# Patient Record
Sex: Male | Born: 1990 | Race: Black or African American | Hispanic: No | Marital: Single | Smoking: Never smoker
Health system: Southern US, Community
[De-identification: ages and names within clinical notes are randomized; demographics above are authoritative.]

---

## 2010-07-12 ENCOUNTER — Ambulatory Visit: Payer: Self-pay | Admitting: Family Medicine

## 2010-10-24 ENCOUNTER — Ambulatory Visit: Payer: Self-pay | Admitting: Internal Medicine

## 2010-11-08 ENCOUNTER — Ambulatory Visit: Payer: Self-pay | Admitting: Internal Medicine

## 2011-07-09 ENCOUNTER — Ambulatory Visit: Payer: Self-pay | Admitting: Internal Medicine

## 2011-11-16 IMAGING — CR DG CHEST 2V
1 series · 3 of 3 positions shown · non-contrast
Comparison: none

REASON FOR EXAM: asthma - please fax report [phone]
COMMENTS:

[Series 1: view not recorded · 0.17mm/px · 3 of 3 slices shown]
[im 1/3]
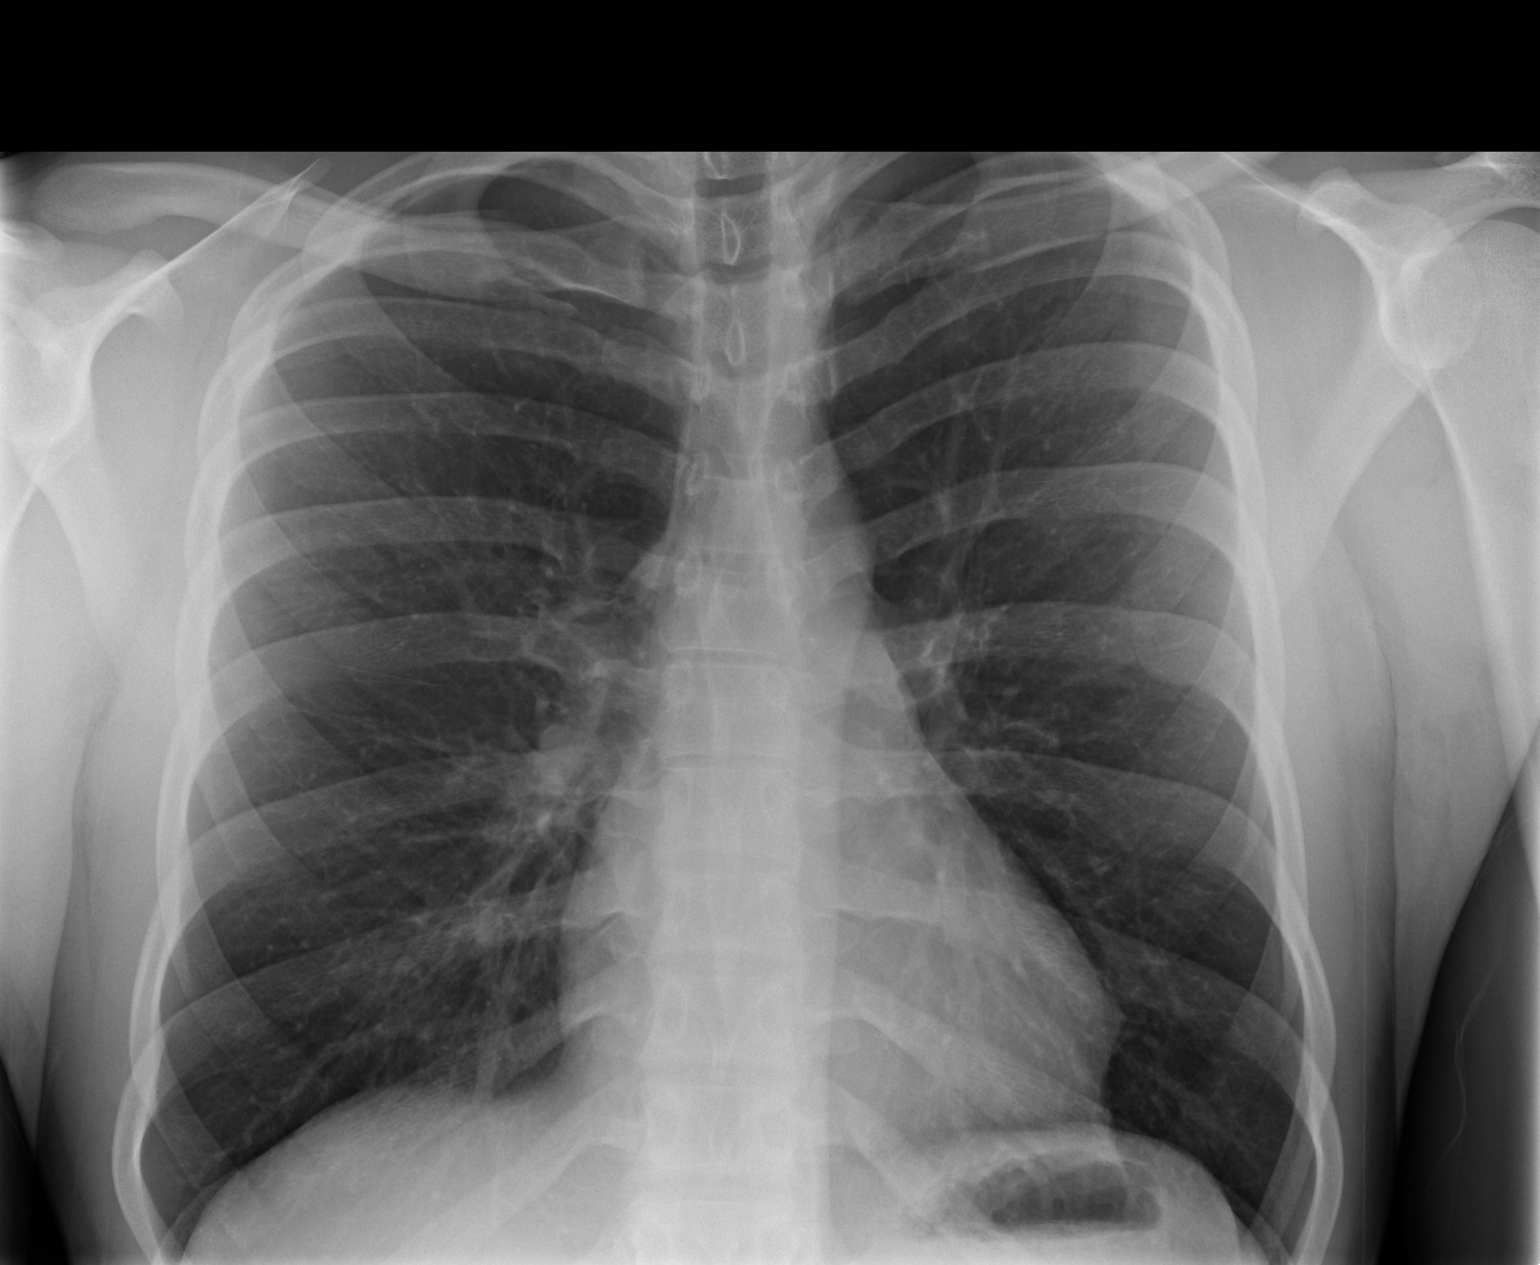
[im 2/3]
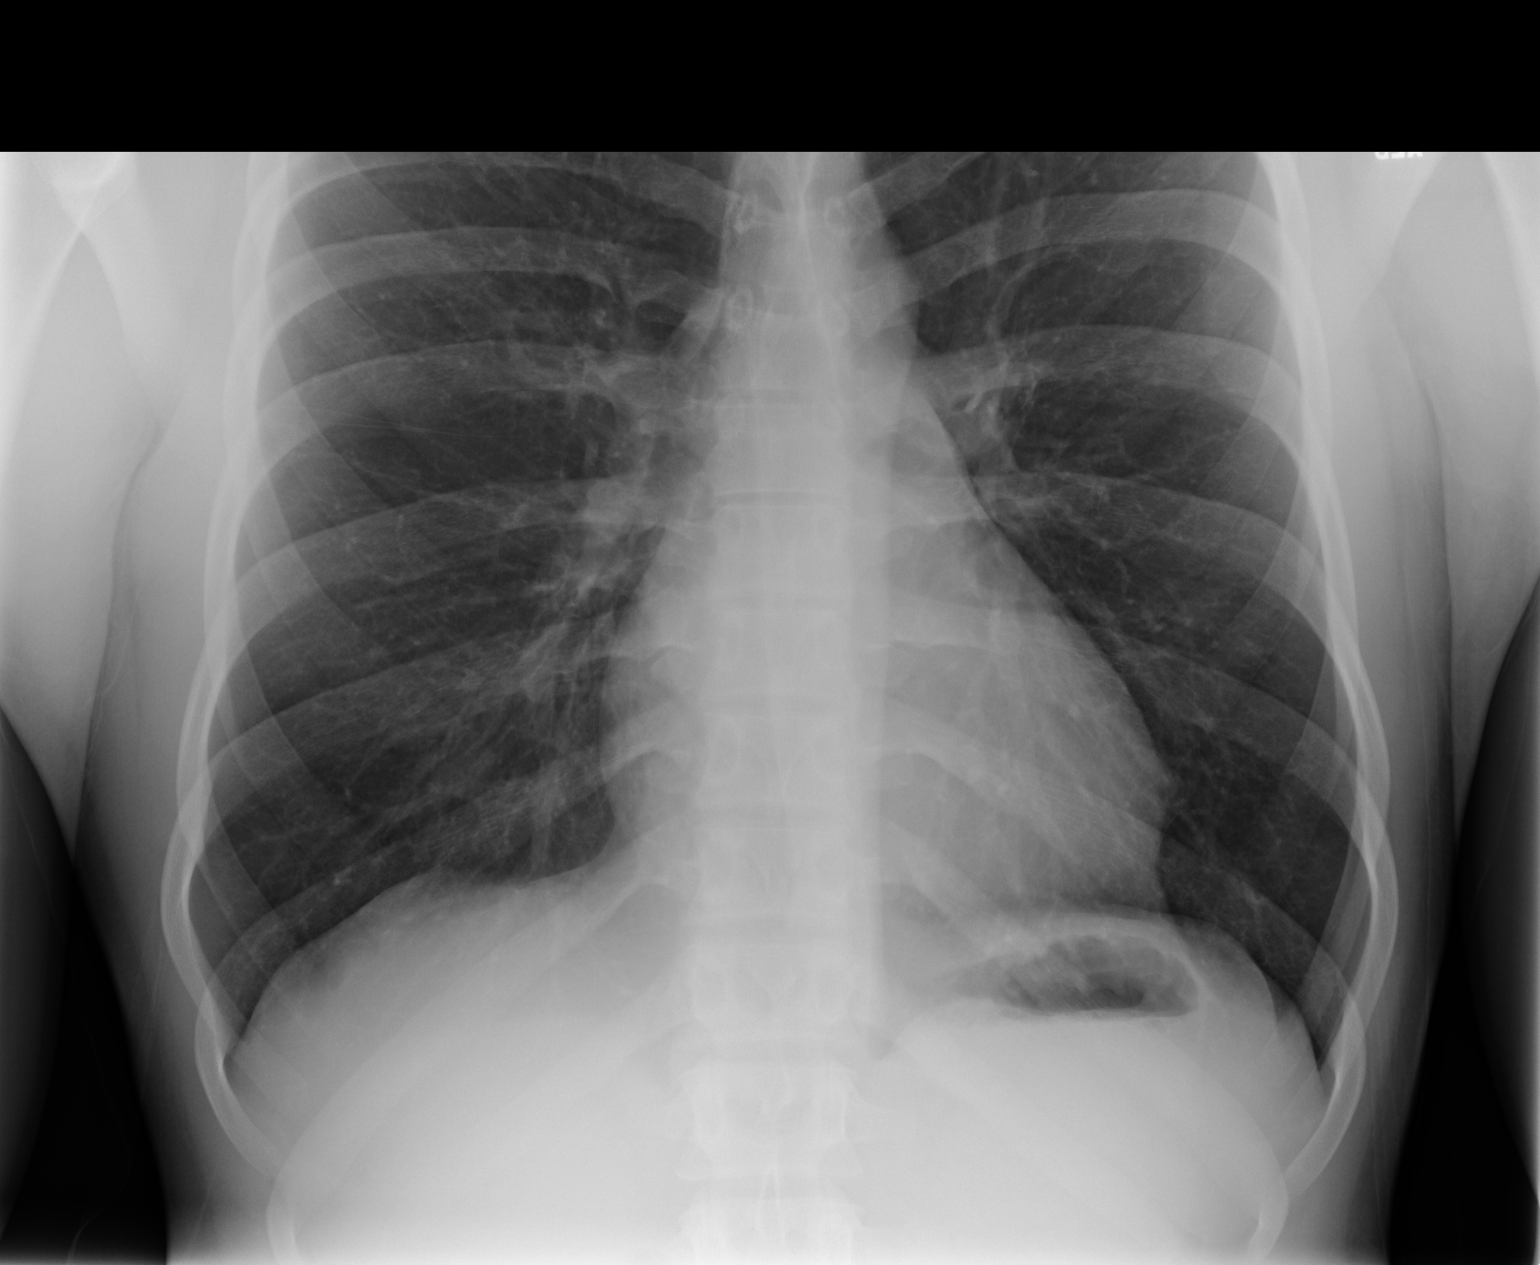
[im 3/3]
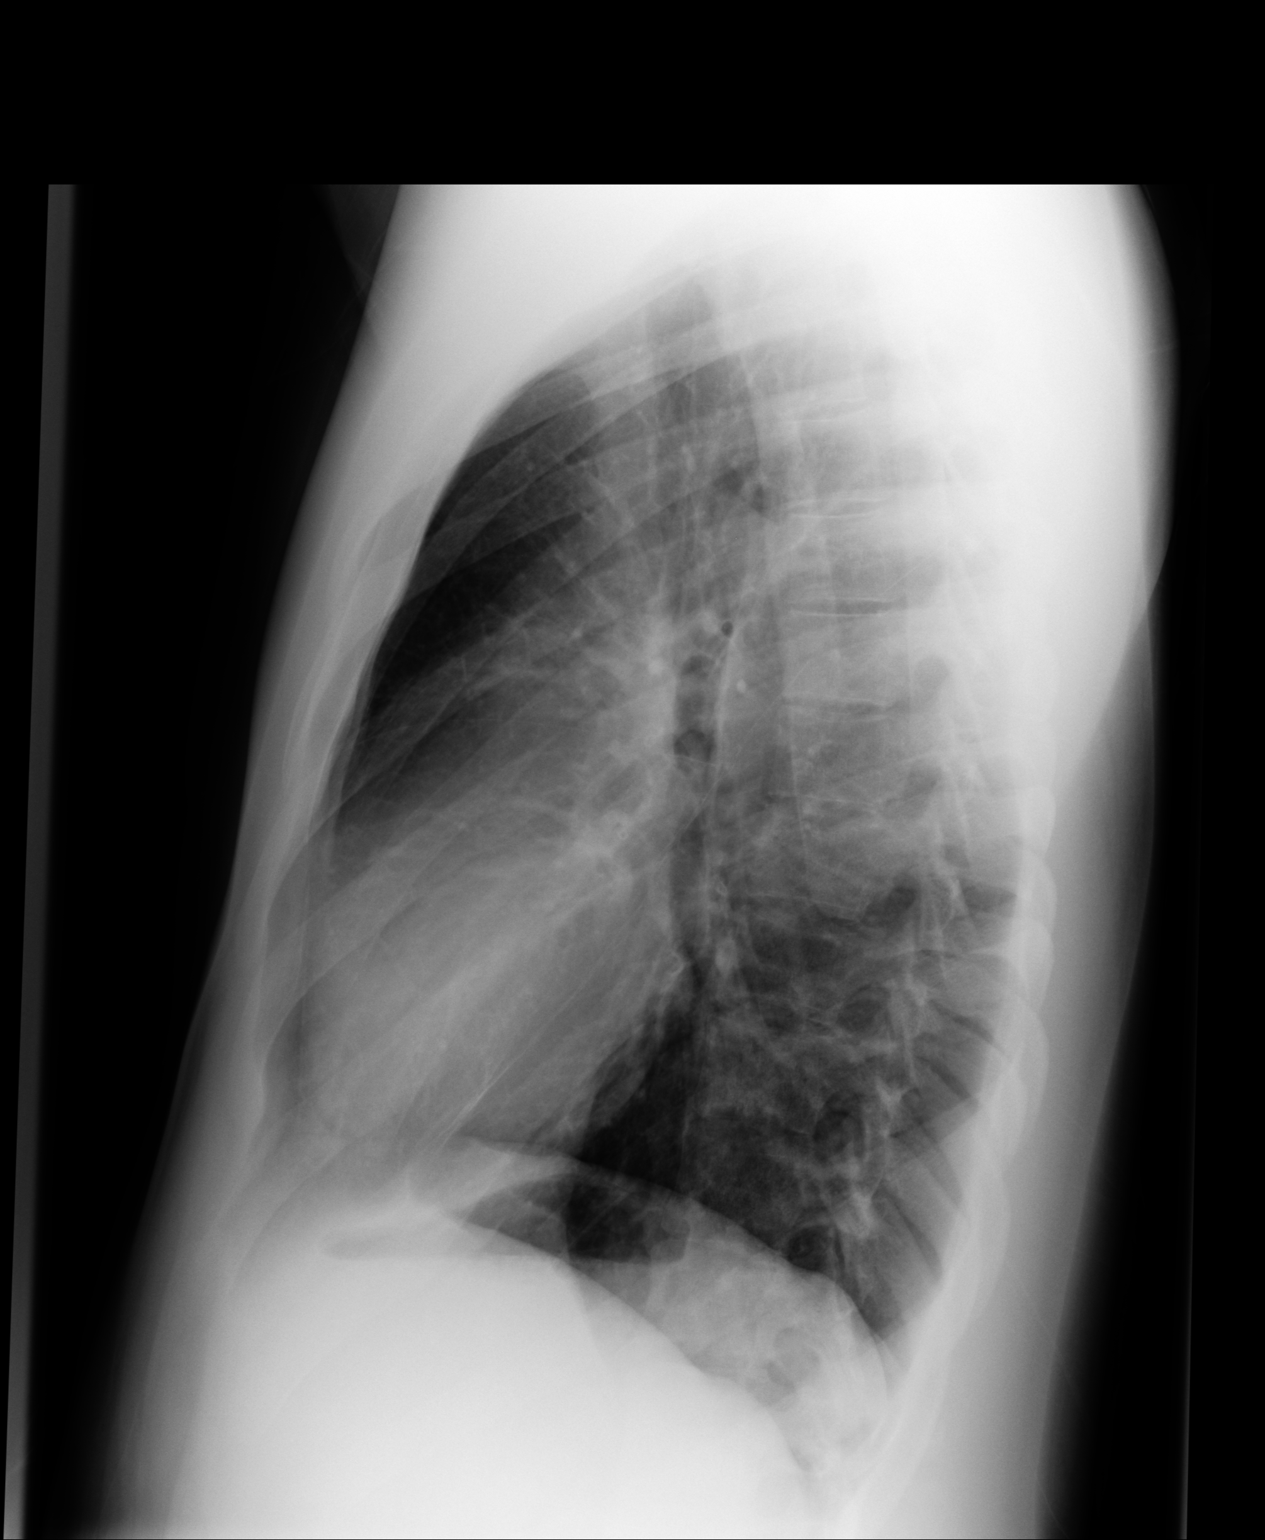

[3 of 3 positions shown; findings below may reference images not displayed]

PROCEDURE:     MDR - MDR CHEST PA(OR AP) AND LATERAL  - November 08, 2010  [DATE]

RESULT:     Comparison is made to study 24 October, 2010.

The lungs are hyperinflated with hemidiaphragm flattening. There is no focal
infiltrate. Minimal prominence of the right perihilar lung markings is noted
suggesting subsegmental atelectasis. The trachea is midline. The cardiac
silhouette is normal in size. There is no pleural effusion or pneumothorax.
The bony thorax exhibits no acute abnormality.
IMPRESSION: The findings are consistent with reactive airway disease. I
cannot exclude minimal perihilar subsegmental atelectasis especially on the
right.

## 2013-05-31 ENCOUNTER — Encounter (HOSPITAL_COMMUNITY): Payer: Self-pay | Admitting: Emergency Medicine

## 2013-05-31 ENCOUNTER — Emergency Department (HOSPITAL_COMMUNITY)
Admission: EM | Admit: 2013-05-31 | Discharge: 2013-05-31 | Disposition: A | Discharge status: Home / Self Care | Payer: 59 | Source: Home / Self Care | Attending: Family Medicine | Admitting: Family Medicine

## 2013-05-31 DIAGNOSIS — J40 Bronchitis, not specified as acute or chronic: Secondary | ICD-10-CM

## 2013-05-31 MED ORDER — PREDNISONE 50 MG PO TABS: 50.0000 mg | ORAL_TABLET | Freq: Every day | ORAL | Status: DC

## 2013-05-31 MED ORDER — IPRATROPIUM BROMIDE 0.02 % IN SOLN
RESPIRATORY_TRACT | Status: AC
  Filled 2013-05-31: qty 2.5

## 2013-05-31 MED ORDER — SODIUM CHLORIDE 0.9 % IN NEBU
INHALATION_SOLUTION | RESPIRATORY_TRACT | Status: AC
  Filled 2013-05-31: qty 6

## 2013-05-31 MED ORDER — ALBUTEROL SULFATE (5 MG/ML) 0.5% IN NEBU
5.0000 mg | INHALATION_SOLUTION | Freq: Once | RESPIRATORY_TRACT | Status: AC
  Administered 2013-05-31: 5 mg via RESPIRATORY_TRACT

## 2013-05-31 MED ORDER — AZITHROMYCIN 250 MG PO TABS: 250.0000 mg | ORAL_TABLET | Freq: Every day | ORAL | Status: DC

## 2013-05-31 MED ORDER — ALBUTEROL SULFATE (5 MG/ML) 0.5% IN NEBU
INHALATION_SOLUTION | RESPIRATORY_TRACT | Status: AC
  Filled 2013-05-31: qty 1

## 2013-05-31 MED ORDER — IPRATROPIUM BROMIDE 0.02 % IN SOLN
0.5000 mg | Freq: Once | RESPIRATORY_TRACT | Status: AC
  Administered 2013-05-31: 0.5 mg via RESPIRATORY_TRACT

## 2013-05-31 MED ORDER — ALBUTEROL SULFATE HFA 108 (90 BASE) MCG/ACT IN AERS: 2.0000 | INHALATION_SPRAY | Freq: Four times a day (QID) | RESPIRATORY_TRACT | Status: DC | PRN

## 2013-05-31 NOTE — ED Provider Notes (Signed)
Michael Munoz is a 22 y.o. male who presents to Urgent Care today for chest tightness for one week. This typically happens during the cold weather is relieved with antibiotics and inhalers. He notes some mild wheezing and mild shortness of breath. He denies any chest pain palpitations or syncope. He feels well otherwise and has not tried any medications. No nausea vomiting or diarrhea.   History reviewed. No pertinent past medical history. History  Substance Use Topics  . Smoking status: Never Smoker   . Smokeless tobacco: Not on file  . Alcohol Use: No   ROS as above Medications reviewed. No current facility-administered medications for this encounter.   Current Outpatient Prescriptions  Medication Sig Dispense Refill  . albuterol (PROVENTIL HFA;VENTOLIN HFA) 108 (90 BASE) MCG/ACT inhaler Inhale 2 puffs into the lungs every 6 (six) hours as needed for wheezing or shortness of breath.  1 Inhaler  2  . azithromycin (ZITHROMAX) 250 MG tablet Take 1 tablet (250 mg total) by mouth daily. Take first 2 tablets together, then 1 every day until finished.  6 tablet  0  . predniSONE (DELTASONE) 50 MG tablet Take 1 tablet (50 mg total) by mouth daily.  5 tablet  0    Exam:  BP 123/58  Pulse 64  Temp(Src) 97.9 F (36.6 C) (Oral)  Resp 16  SpO2 98% Gen: Well NAD HEENT: EOMI,  MMM Lungs: CTABL Nl WOB no significant wheezing Heart: RRR no MRG Abd: NABS, NT, ND Exts: Non edematous BL  LE, warm and well perfused.   Patient was given a DuoNeb nebulizer treatment and had considerable improvement in symptoms.  No results found for this or any previous visit (from the past 24 hour(s)). No results found.  Assessment and Plan: 22 y.o. male with bronchitis. Plan to treat with albuterol azithromycin and prednisone. Discussed warning signs or symptoms. Please see discharge instructions. Patient expresses understanding.    Rodolph Bong, MD 05/31/13 2018

## 2013-05-31 NOTE — ED Notes (Signed)
Pt c/o chest tightness onset 1 week Has had this before in the past; especially w/the change of weather Has been given antibiotics and inhalers and will feel better Denies: f/v/n/d, inj/trauma Sxs include: SOB, wheezing Alert w/no signs of acute distress.

## 2015-09-06 ENCOUNTER — Emergency Department (INDEPENDENT_AMBULATORY_CARE_PROVIDER_SITE_OTHER): Payer: Self-pay

## 2015-09-06 ENCOUNTER — Encounter (HOSPITAL_COMMUNITY): Payer: Self-pay | Admitting: Emergency Medicine

## 2015-09-06 ENCOUNTER — Emergency Department (INDEPENDENT_AMBULATORY_CARE_PROVIDER_SITE_OTHER)
Admission: EM | Admit: 2015-09-06 | Discharge: 2015-09-06 | Disposition: A | Discharge status: Home / Self Care | Payer: Self-pay | Source: Home / Self Care | Attending: Family Medicine | Admitting: Family Medicine

## 2015-09-06 DIAGNOSIS — M545 Low back pain, unspecified: Secondary | ICD-10-CM

## 2015-09-06 DIAGNOSIS — M542 Cervicalgia: Secondary | ICD-10-CM
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
## 2016-08-16 ENCOUNTER — Emergency Department (HOSPITAL_COMMUNITY)
Admission: EM | Admit: 2016-08-16 | Discharge: 2016-08-16 | Disposition: A | Discharge status: Home / Self Care | Payer: Self-pay | Attending: Emergency Medicine | Admitting: Emergency Medicine

## 2016-08-16 ENCOUNTER — Encounter (HOSPITAL_COMMUNITY): Payer: Self-pay | Admitting: Emergency Medicine

## 2016-08-16 ENCOUNTER — Emergency Department (HOSPITAL_COMMUNITY): Payer: Self-pay

## 2016-08-16 DIAGNOSIS — J9801 Acute bronchospasm: Secondary | ICD-10-CM

## 2016-08-16 DIAGNOSIS — J208 Acute bronchitis due to other specified organisms: Secondary | ICD-10-CM | POA: Insufficient documentation

## 2016-08-16 DIAGNOSIS — B349 Viral infection, unspecified: Secondary | ICD-10-CM

## 2016-08-16 MED ORDER — AEROCHAMBER PLUS W/MASK MISC
Status: AC
  Administered 2016-08-16: 1
  Filled 2016-08-16: qty 1

## 2016-08-16 MED ORDER — ALBUTEROL SULFATE (2.5 MG/3ML) 0.083% IN NEBU
5.0000 mg | INHALATION_SOLUTION | Freq: Once | RESPIRATORY_TRACT | Status: AC
  Administered 2016-08-16: 5 mg via RESPIRATORY_TRACT
  Filled 2016-08-16: qty 6

## 2016-08-16 MED ORDER — ALBUTEROL SULFATE HFA 108 (90 BASE) MCG/ACT IN AERS
2.0000 | INHALATION_SPRAY | RESPIRATORY_TRACT | Status: DC | PRN
  Administered 2016-08-16: 2 via RESPIRATORY_TRACT
  Filled 2016-08-16: qty 6.7

## 2016-08-16 MED ORDER — PREDNISONE 20 MG PO TABS
60.0000 mg | ORAL_TABLET | Freq: Once | ORAL | Status: AC
  Administered 2016-08-16: 60 mg via ORAL
  Filled 2016-08-16: qty 3

## 2016-08-16 MED ORDER — PREDNISONE 20 MG PO TABS: 60.0000 mg | ORAL_TABLET | Freq: Every day | ORAL | 0 refills | Status: AC

## 2016-08-16 NOTE — ED Provider Notes (Signed)
[facility]-EMERGENCY DEPT Provider Note   CSN: [id number] Arrival date & time: 08/16/16  0421     History   Chief Complaint Chief Complaint  Patient presents with  . Shortness of Breath    HPI Michael Munoz is a 26 y.o. male.  He comes in with a 2 day history of chest tightness and difficulty breathing. He denies cough. He has had subjective fever and some chills and sweats. He denies arthralgias or myalgias. He has had similar episodes in the past which have usually responded to inhalers and short course of antibiotics. He denies any sick contacts. He is a cigarette smoker and did not receive the influenza vaccination this year.      History reviewed. No pertinent past medical history.  There are no active problems to display for this patient.   History reviewed. No pertinent surgical history.     Home Medications    Prior to Admission medications   Medication Sig Start Date End Date Taking? Authorizing Provider  albuterol (PROVENTIL HFA;VENTOLIN HFA) 108 (90 BASE) MCG/ACT inhaler Inhale 2 puffs into the lungs every 6 (six) hours as needed for wheezing or shortness of breath. 05/31/13   Rodolph Bong, MD  azithromycin (ZITHROMAX) 250 MG tablet Take 1 tablet (250 mg total) by mouth daily. Take first 2 tablets together, then 1 every day until finished. 05/31/13   Rodolph Bong, MD  predniSONE (DELTASONE) 50 MG tablet Take 1 tablet (50 mg total) by mouth daily. 05/31/13   Rodolph Bong, MD    Family History No family history on file.  Social History Social History  Substance Use Topics  . Smoking status: Never Smoker  . Smokeless tobacco: Never Used  . Alcohol use No     Allergies   Patient has no known allergies.   Review of Systems Review of Systems  All other systems reviewed and are negative.    Physical Exam Updated Vital Signs BP 120/89   Pulse 75   Temp 97.3 F (36.3 C) (Oral)   Resp 16   Ht 6\' 4"  (1.93 m)   Wt 190 lb (86.2 kg)   SpO2 96%   BMI  23.13 kg/m   Physical Exam  Nursing note and vitals reviewed.  26 year old male, resting comfortably and in no acute distress. Vital signs are normal . Oxygen saturation is 96%, which is normal. Head is normocephalic and atraumatic. PERRLA, EOMI. Oropharynx is clear. Neck is nontender and supple without adenopathy or JVD. Back is nontender and there is no CVA tenderness. Lungs have fairly good airflow, but diffuse expiratory wheezes. There are no rales or rhonchi. Chest is nontender. Heart has regular rate and rhythm without murmur. Abdomen is soft, flat, nontender without masses or hepatosplenomegaly and peristalsis is normoactive. Extremities have no cyanosis or edema, full range of motion is present. Skin is warm and dry without rash. Neurologic: Mental status is normal, cranial nerves are intact, there are no motor or sensory deficits.  ED Treatments / Results   EKG  EKG Interpretation  Date/Time:  Wednesday August 16 2016 04:53:12 EST Ventricular Rate:  81 PR Interval:    QRS Duration: 83 QT Interval:  367 QTC Calculation: 426 R Axis:   86 Text Interpretation:  Sinus rhythm Consider right atrial enlargement RSR' in V1 or V2, probably normal variant ST elevation suggests  Early repolarization No old tracing to compare Confirmed by [facility]  MD, Tonya Carlile ([id number]) on 08/16/2016 5:01:57 AM  Radiology Dg Chest 2 View  Result Date: 08/16/2016 CLINICAL DATA:  Shortness of breath and mid chest pain for 2 days. EXAM: CHEST  2 VIEW COMPARISON:  None. FINDINGS: The cardiomediastinal contours are normal. Minimal streaky lingular atelectasis. Pulmonary vasculature is normal. No consolidation, pleural effusion, or pneumothorax. No acute osseous abnormalities are seen. IMPRESSION: Minimal streaky lingular atelectasis. Electronically Signed   By: Rubye OaksMelanie  Ehinger M.D.   On: 08/16/2016 05:15    Procedures Procedures (including critical care time)  Medications Ordered in ED Medications    predniSONE (DELTASONE) tablet 60 mg (not administered)  albuterol (PROVENTIL) (2.5 MG/3ML) 0.083% nebulizer solution 5 mg (5 mg Nebulization Given 08/16/16 0454)     Initial Impression / Assessment and Plan / ED Course  I have reviewed the triage vital signs and the nursing notes.  Pertinent imaging results that were available during my care of the patient were reviewed by me and considered in my medical decision making (see chart for details).  Respiratory tract infection with bronchospasm. Old records are reviewed, and he does have an urgent care visit several years ago with similar presentation. He is given a dose of prednisone and given a nebulizer treatment in the ED.  Following above-noted treatment, lungs are completely clear. Chest x-ray shows no evidence of pneumonia. He is discharged with an albuterol inhaler and a prescription for 5 day course of prednisone. Encouraged to stop smoking and to get his flu shot every year.  Final Clinical Impressions(s) / ED Diagnoses   Final diagnoses:  Acute bronchospasm due to viral infection    New Prescriptions Current Discharge Medication List       Dione Boozeavid Tyvion Edmondson, MD 08/16/16 [phone]

## 2016-08-16 NOTE — ED Triage Notes (Signed)
Pt states he has been short of breath for the past 2 days with increasing difficulty. Pt denies being around anyone with any illness. Pt denies respiratory history.

## 2016-08-16 NOTE — Discharge Instructions (Signed)
Get your flu shot every fall!

## 2016-09-09 ENCOUNTER — Encounter (HOSPITAL_COMMUNITY): Payer: Self-pay | Admitting: Neurology

## 2016-09-09 ENCOUNTER — Emergency Department (HOSPITAL_COMMUNITY)
Admission: EM | Admit: 2016-09-09 | Discharge: 2016-09-09 | Disposition: A | Discharge status: Home / Self Care | Payer: Self-pay | Attending: Emergency Medicine | Admitting: Emergency Medicine

## 2016-09-09 DIAGNOSIS — B9789 Other viral agents as the cause of diseases classified elsewhere: Secondary | ICD-10-CM

## 2016-09-09 DIAGNOSIS — J069 Acute upper respiratory infection, unspecified: Secondary | ICD-10-CM | POA: Insufficient documentation

## 2016-09-09 MED ORDER — ALBUTEROL SULFATE HFA 108 (90 BASE) MCG/ACT IN AERS
2.0000 | INHALATION_SPRAY | RESPIRATORY_TRACT | Status: DC | PRN
  Administered 2016-09-09: 2 via RESPIRATORY_TRACT
  Filled 2016-09-09: qty 6.7

## 2016-09-09 NOTE — Discharge Instructions (Signed)
Use inhaler 2 puffs every 4 hrs as needed for your shortness of breath.

## 2016-09-09 NOTE — ED Provider Notes (Signed)
[facility]-EMERG[facility]Y DEPT Provider Note   CSN: [id number] Arrival date & time: 09/09/16  1015   By signing my name below, I, Clarisse GougeXavier Herndon, attest that this documentation has been prepared under the direction and in the presence of Fayrene HelperBowie Jessaca Philippi, PA-C . Electronically Signed: Clarisse GougeXavier Herndon, Scribe. 09/09/16. 11:48 AM.   History   Chief Complaint Chief Complaint  Patient presents with  . Cough   The history is provided by the patient and medical records. No language interpreter was used.    HPI Comments: Michael Munoz is a 26 y.o. male with Hx of recurring, seasonal bronchitis who presents to the Emergency Department complaining of persistent, intermittent SOB x 1 week. Pt notes associated improving productive cough, body aches, fatigue, warm sensation, deep inspiration-related chest pain, congestion, fever and chills that have subsided. He notes sputum with cough has become clear, though it started out yellow and green. He notes he has treated symptoms with mucinex with moderate relief. Marijuana use noted ~6 times per month. Pt denies difficulty swallowing, Hx of blood clots or surgery, coughing up blood, recent immobilization, sore throat, GI issues, abdominal pain, dysuria, rash or urine with change in color.  History reviewed. No pertinent past medical history.  There are no active problems to display for this patient.   History reviewed. No pertinent surgical history.     Home Medications    Prior to Admission medications   Medication Sig Start Date End Date Taking? Authorizing Provider  predniSONE (DELTASONE) 20 MG tablet Take 3 tablets (60 mg total) by mouth daily. 08/16/16   Dione Boozeavid Glick, MD    Family History No family history on file.  Social History Social History  Substance Use Topics  . Smoking status: Never Smoker  . Smokeless tobacco: Never Used  . Alcohol use Yes     Allergies   Patient has no known allergies.   Review of Systems Review of Systems    Constitutional: Positive for chills, fatigue and fever. Negative for activity change.  HENT: Positive for congestion and rhinorrhea. Negative for trouble swallowing.   Respiratory: Positive for cough, shortness of breath and wheezing.   Cardiovascular: Positive for chest pain.  Gastrointestinal: Negative.  Negative for abdominal pain, diarrhea, nausea and vomiting.  Genitourinary: Negative for dysuria.  Musculoskeletal: Positive for myalgias.  Skin: Negative for rash.    Physical Exam Updated Vital Signs BP 109/80 (BP Location: Left Arm)   Pulse 75   Temp 99 F (37.2 C) (Oral)   Resp 19   SpO2 97%   Physical Exam  Constitutional: He is oriented to person, place, and time. He appears well-developed and well-nourished.  HENT:  Head: Normocephalic and atraumatic.  Right Ear: External ear normal.  Left Ear: External ear normal.  Nose: No sinus tenderness or nasal deformity.  Mouth/Throat: Uvula is midline, oropharynx is clear and moist and mucous membranes are normal. No oropharyngeal exudate, posterior oropharyngeal edema or posterior oropharyngeal erythema.  Nasal turbinate swelling noted; No nasal erythema noted.  Eyes: Conjunctivae and EOM are normal. Pupils are equal, round, and reactive to light.  Neck: Normal range of motion. Neck supple. No JVD present.  Cardiovascular: Normal rate and regular rhythm.  Exam reveals no gallop and no friction rub.   No murmur heard. Cap refil < 2 seconds  Pulmonary/Chest: Effort normal. No respiratory distress. He has no decreased breath sounds. He has wheezes (inspiratory). He has no rhonchi. He has no rales.  No pain with deep inspiration.   Abdominal:  He exhibits no distension. There is no rebound and no guarding.  Musculoskeletal: Normal range of motion. He exhibits no edema, tenderness or deformity.  No TTP of the chest, back or shoulders.  Neurological: He is alert and oriented to person, place, and time.  Skin: No rash noted. No  pallor.  Psychiatric: He has a normal mood and affect. His behavior is normal.  Nursing note and vitals reviewed.    ED Treatments / Results  DIAGNOSTIC STUDIES: Oxygen Saturation is 97% on RA, normal by my interpretation.    COORDINATION OF CARE: 11:37 AM Discussed treatment plan with pt at bedside and pt agreed to plan. Will order medication and prepare pt for discharge.  Labs (all labs ordered are listed, but only abnormal results are displayed) Labs Reviewed - No data to display  EKG  EKG Interpretation None       Radiology No results found.  Procedures Procedures (including critical care time)  Medications Ordered in ED Medications  albuterol (PROVENTIL HFA;VENTOLIN HFA) 108 (90 Base) MCG/ACT inhaler 2 puff (not administered)     Initial Impression / Assessment and Plan / ED Course  I have reviewed the triage vital signs and the nursing notes.  Pertinent labs & imaging results that were available during my care of the patient were reviewed by me and considered in my medical decision making (see chart for details).     I personally performed the services described in this documentation, which was scribed in my presence. The recorded information has been reviewed and is accurate.     Final Clinical Impressions(s) / ED Diagnoses   Final diagnoses:  Viral URI with cough    New Prescriptions New Prescriptions   No medications on file   11:49 AM Pt here with sxs suggestive of URI.  Does have faint wheezes, and will likely benefit a rescue inhaler as needed.  PERC negative, doubt PE.  No fever or hypoxia to suggest PNA. Stable for discharge.  Return precaution given.     Fayrene Helper, PA-C 09/09/16 1150    Raeford Razor, MD 09/25/16 [phone]

## 2016-09-09 NOTE — ED Notes (Signed)
Declined W/C at D/C and was escorted to lobby by RN. 

## 2016-09-09 NOTE — ED Triage Notes (Signed)
Pt reports 1 week of not feeling well with cough, lower back pain. Runny nose. Is a x 4. In NAD.
